# Patient Record
Sex: Female | Born: 1975 | Hispanic: No | Marital: Married | Smoking: Never smoker
Health system: Southern US, Community
[De-identification: ages and names within clinical notes are randomized; demographics above are authoritative.]

## PROBLEM LIST (undated history)

## (undated) DIAGNOSIS — Z87442 Personal history of urinary calculi: Secondary | ICD-10-CM

## (undated) DIAGNOSIS — R2231 Localized swelling, mass and lump, right upper limb: Secondary | ICD-10-CM

## (undated) DIAGNOSIS — Z98811 Dental restoration status: Secondary | ICD-10-CM

---

## 2004-07-11 DIAGNOSIS — Z87442 Personal history of urinary calculi: Secondary | ICD-10-CM

## 2004-07-11 HISTORY — DX: Personal history of urinary calculi: Z87.442

## 2012-08-24 ENCOUNTER — Other Ambulatory Visit: Payer: Self-pay | Admitting: *Deleted

## 2012-08-24 DIAGNOSIS — N838 Other noninflammatory disorders of ovary, fallopian tube and broad ligament: Secondary | ICD-10-CM

## 2012-08-26 ENCOUNTER — Emergency Department (HOSPITAL_BASED_OUTPATIENT_CLINIC_OR_DEPARTMENT_OTHER)
Admission: EM | Admit: 2012-08-26 | Discharge: 2012-08-26 | Disposition: A | Discharge status: Home / Self Care | Payer: 59 | Attending: Emergency Medicine | Admitting: Emergency Medicine

## 2012-08-26 ENCOUNTER — Emergency Department (HOSPITAL_BASED_OUTPATIENT_CLINIC_OR_DEPARTMENT_OTHER): Payer: 59

## 2012-08-26 ENCOUNTER — Encounter (HOSPITAL_BASED_OUTPATIENT_CLINIC_OR_DEPARTMENT_OTHER): Payer: Self-pay | Admitting: *Deleted

## 2012-08-26 DIAGNOSIS — IMO0002 Reserved for concepts with insufficient information to code with codable children: Secondary | ICD-10-CM | POA: Insufficient documentation

## 2012-08-26 DIAGNOSIS — X500XXA Overexertion from strenuous movement or load, initial encounter: Secondary | ICD-10-CM | POA: Insufficient documentation

## 2012-08-26 DIAGNOSIS — Z3202 Encounter for pregnancy test, result negative: Secondary | ICD-10-CM | POA: Insufficient documentation

## 2012-08-26 DIAGNOSIS — Y9239 Other specified sports and athletic area as the place of occurrence of the external cause: Secondary | ICD-10-CM | POA: Insufficient documentation

## 2012-08-26 DIAGNOSIS — S76219A Strain of adductor muscle, fascia and tendon of unspecified thigh, initial encounter: Secondary | ICD-10-CM

## 2012-08-26 DIAGNOSIS — Y9373 Activity, racquet and hand sports: Secondary | ICD-10-CM | POA: Insufficient documentation

## 2012-08-26 DIAGNOSIS — Y92838 Other recreation area as the place of occurrence of the external cause: Secondary | ICD-10-CM | POA: Insufficient documentation

## 2012-08-26 DIAGNOSIS — R296 Repeated falls: Secondary | ICD-10-CM | POA: Insufficient documentation

## 2012-08-26 MED ORDER — IBUPROFEN 800 MG PO TABS
800.0000 mg | ORAL_TABLET | Freq: Once | ORAL | Status: AC
  Administered 2012-08-26: 800 mg via ORAL
  Filled 2012-08-26: qty 1

## 2012-08-26 MED ORDER — OXYCODONE-ACETAMINOPHEN 5-325 MG PO TABS: 2.0000 | ORAL_TABLET | ORAL | Status: DC | PRN

## 2012-08-26 MED ORDER — IBUPROFEN 800 MG PO TABS: 800.0000 mg | ORAL_TABLET | Freq: Three times a day (TID) | ORAL | Status: DC

## 2012-08-26 MED ORDER — OXYCODONE-ACETAMINOPHEN 5-325 MG PO TABS
1.0000 | ORAL_TABLET | Freq: Once | ORAL | Status: AC
  Administered 2012-08-26: 1 via ORAL
  Filled 2012-08-26 (×2): qty 1

## 2012-08-26 MED ORDER — FENTANYL CITRATE 0.05 MG/ML IJ SOLN
INTRAMUSCULAR | Status: AC
  Administered 2012-08-26: 50 ug
  Filled 2012-08-26: qty 2

## 2012-08-26 NOTE — ED Provider Notes (Signed)
History     CSN: [id number]  Arrival date & time 08/26/12  1826   First MD Initiated Contact with Patient 08/26/12 2105      Chief Complaint  Patient presents with  . Groin Pain    (Consider location/radiation/quality/duration/timing/severity/associated sxs/prior treatment) Patient is a 37 y.o. female presenting with leg pain. The history is provided by the patient. No language interpreter was used.  Leg Pain Location:  Leg Time since incident:  3 hours Injury: yes   Mechanism of injury: fall   Fall:    Height of fall:  Pt twisted and fell while playing tennis   Impact surface:  Athletic surface Leg location:  R upper leg Pain details:    Quality:  Aching   Radiates to:  R leg   Severity:  Severe   Onset quality:  Sudden   Progression:  Worsening Chronicity:  New Dislocation: no     History reviewed. No pertinent past medical history.  History reviewed. No pertinent past surgical history.  History reviewed. No pertinent family history.  History  Substance Use Topics  . Smoking status: Never Smoker   . Smokeless tobacco: Not on file  . Alcohol Use: No    OB History   Grav Para Term Preterm Abortions TAB SAB Ect Mult Living                  Review of Systems  Musculoskeletal: Positive for myalgias and joint swelling.    Allergies  Review of patient's allergies indicates no known allergies.  Home Medications   Current Outpatient Rx  Name  Route  Sig  Dispense  Refill  . AMOXICILLIN PO   Oral   Take by mouth.           BP 97/57  Pulse 86  Temp(Src) 98.1 F (36.7 C) (Oral)  Resp 20  Ht 5\' 1"  (1.549 m)  Wt 106 lb (48.081 kg)  BMI 20.04 kg/m2  SpO2 96%  LMP 08/10/2012  Physical Exam  Nursing note and vitals reviewed. Constitutional: She is oriented to person, place, and time. She appears well-developed and well-nourished.  HENT:  Head: Normocephalic.  Cardiovascular: Normal rate.   Pulmonary/Chest: Effort normal.  Musculoskeletal:  She exhibits tenderness.  Tender right groin area to palpation,  Pain with range of motion    Neurological: She is alert and oriented to person, place, and time. She has normal reflexes.  Skin: Skin is warm.  Psychiatric: She has a normal mood and affect.    ED Course  Procedures (including critical care time)  Labs Reviewed  PREGNANCY, URINE   Dg Hip Complete Right  08/26/2012  *RADIOLOGY REPORT*  Clinical Data: Fall.  Right hip pain.  RIGHT HIP - COMPLETE 2+ VIEW  Comparison: None.  Findings: The bony pelvis, sacrum and sacroiliac joints are normal. The hip joints are symmetrical.  Frontal and lateral views of the proximal right femur show no evidence of trauma.  There are no soft tissue abnormalities.  IMPRESSION: No evidence of acute skeletal trauma.   Original Report Authenticated By: Sander Radon, M.D.      No diagnosis found.    MDM  Pt given percocet,   Crutches.   I advised see Dr. Pearletha Forge for recheck this week.        Lonia Skinner [place], [place] 08/26/12 2205

## 2012-08-26 NOTE — ED Notes (Signed)
Pt was playing tennis earlier today twisted her right leg in the groin area. Now c/o pain to same

## 2012-08-26 NOTE — ED Provider Notes (Signed)
Medical screening examination/treatment/procedure(s) were performed by non-physician practitioner and as supervising physician I was immediately available for consultation/collaboration.  Ethelda Chick, MD 08/26/12 2215

## 2012-08-26 NOTE — Discharge Instructions (Signed)
Muscle Strain °Muscle strain occurs when a muscle is stretched beyond its normal length. A small number of muscle fibers generally are torn. This is especially common in athletes. This happens when a sudden, violent force placed on a muscle stretches it too far. Usually, recovery from muscle strain takes 1 to 2 weeks. Complete healing will take 5 to 6 weeks.  °HOME CARE INSTRUCTIONS  °· While awake, apply ice to the sore muscle for the first 2 days after the injury. °· Put ice in a plastic bag. °· Place a towel between your skin and the bag. °· Leave the ice on for 15 to 20 minutes each hour. °· Do not use the strained muscle for several days, until you no longer have pain. °· You may wrap the injured area with an elastic bandage for comfort. Be careful not to wrap it too tightly. This may interfere with blood circulation or increase swelling. °· Only take over-the-counter or prescription medicines for pain, discomfort, or fever as directed by your caregiver. °SEEK MEDICAL CARE IF:  °You have increasing pain or swelling in the injured area. °MAKE SURE YOU:  °· Understand these instructions. °· Will watch your condition. °· Will get help right away if you are not doing well or get worse. °Document Released: 06/27/2005 Document Revised: 09/19/2011 Document Reviewed: 07/09/2011 °[employer]® Patient Information ©2013 [employer]. ° °

## 2012-08-29 ENCOUNTER — Ambulatory Visit
Admission: RE | Admit: 2012-08-29 | Discharge: 2012-08-29 | Disposition: A | Discharge status: Home / Self Care | Payer: 59 | Source: Ambulatory Visit | Attending: *Deleted | Admitting: *Deleted

## 2013-05-29 ENCOUNTER — Other Ambulatory Visit: Payer: Self-pay | Admitting: Internal Medicine

## 2013-06-13 ENCOUNTER — Other Ambulatory Visit: Payer: Self-pay | Admitting: Internal Medicine

## 2013-06-13 DIAGNOSIS — N63 Unspecified lump in unspecified breast: Secondary | ICD-10-CM

## 2013-06-21 ENCOUNTER — Other Ambulatory Visit: Payer: Self-pay | Admitting: Internal Medicine

## 2013-06-21 ENCOUNTER — Other Ambulatory Visit: Payer: Self-pay

## 2013-06-21 DIAGNOSIS — N63 Unspecified lump in unspecified breast: Secondary | ICD-10-CM

## 2013-06-27 ENCOUNTER — Other Ambulatory Visit: Payer: 59

## 2013-07-22 ENCOUNTER — Other Ambulatory Visit: Payer: Self-pay | Admitting: Internal Medicine

## 2013-07-22 DIAGNOSIS — N63 Unspecified lump in unspecified breast: Secondary | ICD-10-CM

## 2013-07-24 ENCOUNTER — Other Ambulatory Visit: Payer: Self-pay | Admitting: Internal Medicine

## 2013-07-24 DIAGNOSIS — N63 Unspecified lump in unspecified breast: Secondary | ICD-10-CM

## 2013-07-25 ENCOUNTER — Ambulatory Visit
Admission: RE | Admit: 2013-07-25 | Discharge: 2013-07-25 | Disposition: A | Discharge status: Home / Self Care | Payer: 59 | Source: Ambulatory Visit | Attending: Internal Medicine | Admitting: Internal Medicine

## 2013-07-25 ENCOUNTER — Other Ambulatory Visit: Payer: 59

## 2013-07-25 DIAGNOSIS — N63 Unspecified lump in unspecified breast: Secondary | ICD-10-CM

## 2013-07-31 ENCOUNTER — Other Ambulatory Visit: Payer: 59

## 2015-04-17 ENCOUNTER — Other Ambulatory Visit: Payer: Self-pay | Admitting: Orthopaedic Surgery

## 2015-04-17 DIAGNOSIS — R2231 Localized swelling, mass and lump, right upper limb: Secondary | ICD-10-CM

## 2015-04-20 ENCOUNTER — Ambulatory Visit
Admission: RE | Admit: 2015-04-20 | Discharge: 2015-04-20 | Disposition: A | Discharge status: Home / Self Care | Payer: 59 | Source: Ambulatory Visit | Attending: Orthopaedic Surgery | Admitting: Orthopaedic Surgery

## 2015-04-20 DIAGNOSIS — R2231 Localized swelling, mass and lump, right upper limb: Secondary | ICD-10-CM

## 2015-04-20 MED ORDER — GADOBENATE DIMEGLUMINE 529 MG/ML IV SOLN
10.0000 mL | Freq: Once | INTRAVENOUS | Status: AC | PRN
  Administered 2015-04-20: 10 mL via INTRAVENOUS

## 2015-04-29 ENCOUNTER — Other Ambulatory Visit: Payer: Self-pay | Admitting: Orthopedic Surgery

## 2015-05-01 ENCOUNTER — Other Ambulatory Visit: Payer: Self-pay

## 2015-05-12 ENCOUNTER — Encounter (HOSPITAL_BASED_OUTPATIENT_CLINIC_OR_DEPARTMENT_OTHER): Payer: Self-pay | Admitting: *Deleted

## 2015-05-12 DIAGNOSIS — R2231 Localized swelling, mass and lump, right upper limb: Secondary | ICD-10-CM

## 2015-05-12 HISTORY — DX: Localized swelling, mass and lump, right upper limb: R22.31

## 2015-05-13 NOTE — Pre-Procedure Instructions (Signed)
[employer] is to interpret for pt., per [place] in [employer]; please call [phone] if surgery time changes.

## 2015-05-14 NOTE — H&P (Signed)
Hannah ClarkYumi Wilkins is an 39 y.o. female.   CC / Reason for Visit: Right small finger mass HPI: Is patient is a 39 year old Asian female who presents for evaluation of a right small finger mass.  She reports that it has developed over the course of about 3 months.  She denies any numbness or tingling with it.  Motion is inhibited.  She underwent MRI evaluation on 04-20-15, which revealed a solid mass 6 x 6 x 9 mm on the radial aspect of the digit near the base of the middle phalanx.  The differential was broad.  Past Medical History  Diagnosis Date  . History of kidney stones 2006  . Dental crown present   . Mass of finger of right hand 05/2015    small finger    History reviewed. No pertinent past surgical history.  History reviewed. No pertinent family history. Social History:  reports that she has never smoked. She has never used smokeless tobacco. She reports that she drinks alcohol. She reports that she does not use illicit drugs.  Allergies: No Known Allergies  No prescriptions prior to admission    No results found for this or any previous visit (from the past 48 hour(s)). No results found.  Review of Systems  All other systems reviewed and are negative.   Height 5' 0.63" (1.54 m), weight 51 kg (112 lb 7 oz), last menstrual period 04/15/2015. Physical Exam  Constitutional:  WD, WN, NAD HEENT:  NCAT, EOMI Neuro/Psych:  Alert & oriented to person, place, and time; appropriate mood & affect Lymphatic: No generalized UE edema or lymphadenopathy Extremities / MSK:  Both UE are normal with respect to appearance, ranges of motion, joint stability, muscle strength/tone, sensation, & perfusion except as otherwise noted:  Right small finger with oblong mass about half millimeter by 1 mm in length on the radial aspect of the level of the PIP joint.  There is no Tinel sign with percussion of the mass.  Intact normal light touch sensibility on the radial ulnar aspects of the digital  tip  Labs / X-rays: No radiographic studies obtained today.  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  We will send the tissue to pathology for tissue specific pathological diagnosis.  I discussed with her the possibility of injury to the digital nerve or vessel that could result in compromise blood flow to the digit or pain or altered sensibility.  In addition she could develop stiffness postoperatively in the digit.  The details of the operative procedure were discussed with the patient.  Questions were invited and answered.  In addition to the goal of the procedure, the risks of the procedure to include but not limited to bleeding; infection; damage to the nerves or blood vessels that could result in bleeding, numbness, weakness, chronic pain, and the need for additional procedures; stiffness; the need for revision surgery; and anesthetic risks, were reviewed.  No specific outcome was guaranteed or implied.  Informed consent was obtained.  She was sent to meet with Rosanne SackKasey to schedule surgery.  Onix Jumper A. 05/14/2015, 11:30 AM

## 2015-05-15 NOTE — Pre-Procedure Instructions (Signed)
Just received email from [place] in [employer] that [employer] will not be able to send a [place] interpreter; will need to use [employer] at [phone];

## 2015-05-18 ENCOUNTER — Ambulatory Visit (HOSPITAL_BASED_OUTPATIENT_CLINIC_OR_DEPARTMENT_OTHER)
Admission: RE | Admit: 2015-05-18 | Discharge: 2015-05-18 | Disposition: A | Discharge status: Home / Self Care | Payer: 59 | Source: Ambulatory Visit | Attending: Orthopedic Surgery | Admitting: Orthopedic Surgery

## 2015-05-18 ENCOUNTER — Encounter (HOSPITAL_BASED_OUTPATIENT_CLINIC_OR_DEPARTMENT_OTHER)
Admission: RE | Disposition: A | Discharge status: Home / Self Care | Payer: Self-pay | Source: Ambulatory Visit | Attending: Orthopedic Surgery

## 2015-05-18 ENCOUNTER — Encounter (HOSPITAL_BASED_OUTPATIENT_CLINIC_OR_DEPARTMENT_OTHER): Payer: Self-pay | Admitting: Anesthesiology

## 2015-05-18 ENCOUNTER — Ambulatory Visit (HOSPITAL_BASED_OUTPATIENT_CLINIC_OR_DEPARTMENT_OTHER): Payer: 59 | Admitting: Anesthesiology

## 2015-05-18 DIAGNOSIS — R2231 Localized swelling, mass and lump, right upper limb: Secondary | ICD-10-CM | POA: Diagnosis present

## 2015-05-18 DIAGNOSIS — E65 Localized adiposity: Secondary | ICD-10-CM | POA: Insufficient documentation

## 2015-05-18 HISTORY — PX: EXCISION METACARPAL MASS: SHX6372

## 2015-05-18 HISTORY — DX: Personal history of urinary calculi: Z87.442

## 2015-05-18 HISTORY — DX: Localized swelling, mass and lump, right upper limb: R22.31

## 2015-05-18 HISTORY — DX: Dental restoration status: Z98.811

## 2015-05-18 SURGERY — EXCISION METACARPAL MASS
Anesthesia: Monitor Anesthesia Care | Site: Finger | Laterality: Right

## 2015-05-18 MED ORDER — MIDAZOLAM HCL 5 MG/5ML IJ SOLN
INTRAMUSCULAR | Status: DC | PRN
  Administered 2015-05-18: 2 mg via INTRAVENOUS

## 2015-05-18 MED ORDER — LACTATED RINGERS IV SOLN
INTRAVENOUS | Status: DC
  Administered 2015-05-18: 10 mL/h via INTRAVENOUS

## 2015-05-18 MED ORDER — PROPOFOL 10 MG/ML IV BOLUS
INTRAVENOUS | Status: AC
  Filled 2015-05-18: qty 20

## 2015-05-18 MED ORDER — BUPIVACAINE-EPINEPHRINE 0.5% -1:200000 IJ SOLN
INTRAMUSCULAR | Status: DC | PRN
  Administered 2015-05-18: 3.5 mL

## 2015-05-18 MED ORDER — DEXAMETHASONE SODIUM PHOSPHATE 10 MG/ML IJ SOLN
INTRAMUSCULAR | Status: AC
  Filled 2015-05-18: qty 1

## 2015-05-18 MED ORDER — ONDANSETRON HCL 4 MG/2ML IJ SOLN: 4.0000 mg | Freq: Once | INTRAMUSCULAR | Status: DC | PRN

## 2015-05-18 MED ORDER — PROPOFOL 10 MG/ML IV BOLUS
INTRAVENOUS | Status: DC | PRN
  Administered 2015-05-18 (×2): 20 mg via INTRAVENOUS

## 2015-05-18 MED ORDER — ONDANSETRON HCL 4 MG/2ML IJ SOLN
INTRAMUSCULAR | Status: DC | PRN
  Administered 2015-05-18: 4 mg via INTRAVENOUS

## 2015-05-18 MED ORDER — BUPIVACAINE-EPINEPHRINE (PF) 0.5% -1:200000 IJ SOLN
INTRAMUSCULAR | Status: AC
  Filled 2015-05-18: qty 30

## 2015-05-18 MED ORDER — FENTANYL CITRATE (PF) 100 MCG/2ML IJ SOLN
INTRAMUSCULAR | Status: DC | PRN
  Administered 2015-05-18 (×2): 50 ug via INTRAVENOUS

## 2015-05-18 MED ORDER — LIDOCAINE HCL (PF) 1 % IJ SOLN
INTRAMUSCULAR | Status: DC | PRN
  Administered 2015-05-18: 3.5 mL

## 2015-05-18 MED ORDER — MIDAZOLAM HCL 2 MG/2ML IJ SOLN: 1.0000 mg | INTRAMUSCULAR | Status: DC | PRN

## 2015-05-18 MED ORDER — HYDROCODONE-ACETAMINOPHEN 5-325 MG PO TABS: 1.0000 | ORAL_TABLET | Freq: Four times a day (QID) | ORAL | Status: AC | PRN

## 2015-05-18 MED ORDER — LIDOCAINE HCL (PF) 1 % IJ SOLN
INTRAMUSCULAR | Status: AC
  Filled 2015-05-18: qty 30

## 2015-05-18 MED ORDER — FENTANYL CITRATE (PF) 100 MCG/2ML IJ SOLN: 50.0000 ug | INTRAMUSCULAR | Status: DC | PRN

## 2015-05-18 MED ORDER — SCOPOLAMINE 1 MG/3DAYS TD PT72: 1.0000 | MEDICATED_PATCH | Freq: Once | TRANSDERMAL | Status: DC | PRN

## 2015-05-18 MED ORDER — LIDOCAINE HCL (CARDIAC) 20 MG/ML IV SOLN
INTRAVENOUS | Status: AC
  Filled 2015-05-18: qty 5

## 2015-05-18 MED ORDER — CEFAZOLIN SODIUM-DEXTROSE 2-3 GM-% IV SOLR
INTRAVENOUS | Status: DC | PRN
  Administered 2015-05-18: 2 g via INTRAVENOUS

## 2015-05-18 MED ORDER — GLYCOPYRROLATE 0.2 MG/ML IJ SOLN: 0.2000 mg | Freq: Once | INTRAMUSCULAR | Status: DC | PRN

## 2015-05-18 MED ORDER — FENTANYL CITRATE (PF) 100 MCG/2ML IJ SOLN: 25.0000 ug | INTRAMUSCULAR | Status: DC | PRN

## 2015-05-18 MED ORDER — LACTATED RINGERS IV SOLN
INTRAVENOUS | Status: DC
  Administered 2015-05-18 (×2): via INTRAVENOUS

## 2015-05-18 MED ORDER — MIDAZOLAM HCL 2 MG/2ML IJ SOLN
INTRAMUSCULAR | Status: AC
  Filled 2015-05-18: qty 4

## 2015-05-18 MED ORDER — LIDOCAINE HCL (CARDIAC) 20 MG/ML IV SOLN
INTRAVENOUS | Status: DC | PRN
  Administered 2015-05-18: 50 mg via INTRAVENOUS

## 2015-05-18 MED ORDER — PROPOFOL 500 MG/50ML IV EMUL: INTRAVENOUS | Status: DC | PRN

## 2015-05-18 MED ORDER — ONDANSETRON HCL 4 MG/2ML IJ SOLN
INTRAMUSCULAR | Status: AC
  Filled 2015-05-18: qty 2

## 2015-05-18 MED ORDER — FENTANYL CITRATE (PF) 100 MCG/2ML IJ SOLN
INTRAMUSCULAR | Status: AC
  Filled 2015-05-18: qty 4

## 2015-05-18 MED ORDER — CEFAZOLIN SODIUM-DEXTROSE 2-3 GM-% IV SOLR: 2.0000 g | INTRAVENOUS | Status: DC

## 2015-05-18 SURGICAL SUPPLY — 35 items
BLADE SURG 15 STRL LF DISP TIS (BLADE) ×1 IMPLANT
BLADE SURG 15 STRL SS (BLADE) ×2
BNDG COHESIVE 1X5 TAN STRL LF (GAUZE/BANDAGES/DRESSINGS) ×3 IMPLANT
BNDG COHESIVE 4X5 TAN STRL (GAUZE/BANDAGES/DRESSINGS) ×3 IMPLANT
BNDG CONFORM 2 STRL LF (GAUZE/BANDAGES/DRESSINGS) ×3 IMPLANT
BNDG ESMARK 4X9 LF (GAUZE/BANDAGES/DRESSINGS) ×3 IMPLANT
CHLORAPREP W/TINT 26ML (MISCELLANEOUS) ×3 IMPLANT
COVER BACK TABLE 60X90IN (DRAPES) ×3 IMPLANT
COVER MAYO STAND STRL (DRAPES) ×3 IMPLANT
CUFF TOURNIQUET SINGLE 18IN (TOURNIQUET CUFF) ×3 IMPLANT
DRAPE EXTREMITY T 121X128X90 (DRAPE) ×3 IMPLANT
DRAPE SURG 17X23 STRL (DRAPES) ×3 IMPLANT
DRSG EMULSION OIL 3X3 NADH (GAUZE/BANDAGES/DRESSINGS) ×3 IMPLANT
GLOVE BIO SURGEON STRL SZ7.5 (GLOVE) ×3 IMPLANT
GLOVE BIOGEL PI IND STRL 7.0 (GLOVE) ×2 IMPLANT
GLOVE BIOGEL PI IND STRL 8 (GLOVE) ×1 IMPLANT
GLOVE BIOGEL PI INDICATOR 7.0 (GLOVE) ×4
GLOVE BIOGEL PI INDICATOR 8 (GLOVE) ×2
GLOVE ECLIPSE 6.5 STRL STRAW (GLOVE) ×6 IMPLANT
GOWN STRL REUS W/ TWL LRG LVL3 (GOWN DISPOSABLE) ×2 IMPLANT
GOWN STRL REUS W/TWL LRG LVL3 (GOWN DISPOSABLE) ×4
GOWN STRL REUS W/TWL XL LVL3 (GOWN DISPOSABLE) ×3 IMPLANT
NEEDLE HYPO 25X1 1.5 SAFETY (NEEDLE) ×3 IMPLANT
NS IRRIG 1000ML POUR BTL (IV SOLUTION) ×3 IMPLANT
PACK BASIN DAY SURGERY FS (CUSTOM PROCEDURE TRAY) ×3 IMPLANT
PADDING CAST ABS 4INX4YD NS (CAST SUPPLIES) ×2
PADDING CAST ABS COTTON 4X4 ST (CAST SUPPLIES) ×1 IMPLANT
SPONGE GAUZE 4X4 12PLY STER LF (GAUZE/BANDAGES/DRESSINGS) ×3 IMPLANT
STOCKINETTE 6  STRL (DRAPES) ×2
STOCKINETTE 6 STRL (DRAPES) ×1 IMPLANT
SUT VICRYL RAPIDE 4-0 (SUTURE) ×3 IMPLANT
SYR BULB 3OZ (MISCELLANEOUS) ×3 IMPLANT
SYRINGE 10CC LL (SYRINGE) ×3 IMPLANT
TOWEL OR 17X24 6PK STRL BLUE (TOWEL DISPOSABLE) ×3 IMPLANT
UNDERPAD 30X30 (UNDERPADS AND DIAPERS) ×3 IMPLANT

## 2015-05-18 NOTE — Transfer of Care (Signed)
Immediate Anesthesia Transfer of Care Note  Patient: Hannah ClarkYumi Wilkins  Procedure(s) Performed: Procedure(s): RIGHT SMALL FINGER MASS EXCISION (Right)  Patient Location: PACU  Anesthesia Type:MAC  Level of Consciousness: awake and patient cooperative  Airway & Oxygen Therapy: Patient Spontanous Breathing and Patient connected to face mask oxygen  Post-op Assessment: Report given to RN and Post -op Vital signs reviewed and stable  Post vital signs: Reviewed and stable  Last Vitals:  Filed Vitals:   05/18/15 1012  BP: 100/75  Pulse: 59  Temp: 36.7 C  Resp: 18    Complications: No apparent anesthesia complications

## 2015-05-18 NOTE — Anesthesia Preprocedure Evaluation (Signed)
Anesthesia Evaluation  Patient identified by MRN, date of birth, ID band Patient awake    Reviewed: Allergy & Precautions, NPO status , Patient's Chart, lab work & pertinent test results  History of Anesthesia Complications Negative for: history of anesthetic complications  Airway Mallampati: II  TM Distance: >3 FB Neck ROM: Full    Dental no notable dental hx. (+) Dental Advisory Given   Pulmonary neg pulmonary ROS,    Pulmonary exam normal breath sounds clear to auscultation       Cardiovascular negative cardio ROS Normal cardiovascular exam Rhythm:Regular Rate:Normal     Neuro/Psych negative neurological ROS  negative psych ROS   GI/Hepatic negative GI ROS, Neg liver ROS,   Endo/Other  negative endocrine ROS  Renal/GU negative Renal ROS  negative genitourinary   Musculoskeletal negative musculoskeletal ROS (+)   Abdominal   Peds negative pediatric ROS (+)  Hematology negative hematology ROS (+)   Anesthesia Other Findings   Reproductive/Obstetrics negative OB ROS                             Anesthesia Physical Anesthesia Plan  ASA: II  Anesthesia Plan: MAC   Post-op Pain Management:    Induction: Intravenous  Airway Management Planned:   Additional Equipment:   Intra-op Plan:   Post-operative Plan:   Informed Consent: I have reviewed the patients History and Physical, chart, labs and discussed the procedure including the risks, benefits and alternatives for the proposed anesthesia with the patient or authorized representative who has indicated his/her understanding and acceptance.   Dental advisory given  Plan Discussed with: CRNA  Anesthesia Plan Comments: (Case posted for MAC. Discussed possible need for conversion to general with LMA if more stimulating. Husband and wife expressed understanding and agreeable to plan.)        Anesthesia Quick Evaluation

## 2015-05-18 NOTE — Discharge Instructions (Signed)
Discharge Instructions ° ° °You have a light dressing on your hand.  °You may begin gentle motion of your fingers and hand immediately, but you should not do any heavy lifting or gripping.  Elevate your hand to reduce pain & swelling of the digits.  Ice over the operative site may be helpful to reduce pain & swelling.  DO NOT USE HEAT. °Pain medicine has been prescribed for you.  °Use your medicine as needed over the first 48 hours, and then you can begin to taper your use. You may use Tylenol in place of your prescribed pain medication, but not IN ADDITION to it. °Leave the dressing in place until the third day after your surgery and then remove it, leaving it open to air.  °After the bandage has been removed you may shower, regularly washing the incision and letting the water run over it, but not submerging it (no swimming, soaking it in dishwater, etc.) °You may drive a car when you are off of prescription pain medications and can safely control your vehicle with both hands. °We will address whether therapy will be required or not when you return to the office. °You may have already made your follow-up appointment when we completed your preop visit.  If not, please call our office today or the next business day to make your return appointment for 10-15 days after surgery. ° ° °Please call [phone] during normal business hours or [phone] after hours for any problems. Including the following: ° °- excessive redness of the incisions °- drainage for more than 4 days °- fever of more than 101.5 F ° °*Please note that pain medications will not be refilled after hours or on weekends. ° ° ° °Post Anesthesia Home Care Instructions ° °Activity: °Get plenty of rest for the remainder of the day. A responsible adult should stay with you for 24 hours following the procedure.  °For the next 24 hours, DO NOT: °-Drive a car °-Operate machinery °-Drink alcoholic beverages °-Take any medication unless instructed by your  physician °-Make any legal decisions or sign important papers. ° °Meals: °Start with liquid foods such as gelatin or soup. Progress to regular foods as tolerated. Avoid greasy, spicy, heavy foods. If nausea and/or vomiting occur, drink only clear liquids until the nausea and/or vomiting subsides. Call your physician if vomiting continues. ° °Special Instructions/Symptoms: °Your throat may feel dry or sore from the anesthesia or the breathing tube placed in your throat during surgery. If this causes discomfort, gargle with warm salt water. The discomfort should disappear within 24 hours. ° °If you had a scopolamine patch placed behind your ear for the management of post- operative nausea and/or vomiting: ° °1. The medication in the patch is effective for 72 hours, after which it should be removed.  Wrap patch in a tissue and discard in the trash. Wash hands thoroughly with soap and water. °2. You may remove the patch earlier than 72 hours if you experience unpleasant side effects which may include dry mouth, dizziness or visual disturbances. °3. Avoid touching the patch. Wash your hands with soap and water after contact with the patch. °  ° ° °Call your surgeon if you experience:  ° °1.  Fever over 101.0. °2.  Inability to urinate. °3.  Nausea and/or vomiting. °4.  Extreme swelling or bruising at the surgical site. °5.  Continued bleeding from the incision. °6.  Increased pain, redness or drainage from the incision. °7.  Problems related to your pain medication. °  8. Any change in color, movement and/or sensation °9. Any problems and/or concerns ° °

## 2015-05-18 NOTE — Anesthesia Procedure Notes (Signed)
Procedure Name: MAC Date/Time: 05/18/2015 11:01 AM Performed by: Elkton DesanctisLINKA, Rodrigus Kilker L Pre-anesthesia Checklist: Patient identified, Timeout performed, Emergency Drugs available, Suction available and Patient being monitored Patient Re-evaluated:Patient Re-evaluated prior to inductionOxygen Delivery Method: Simple face mask

## 2015-05-18 NOTE — Anesthesia Postprocedure Evaluation (Signed)
  Anesthesia Post-op Note  Patient: Hannah ClarkYumi Wilkins  Procedure(s) Performed: Procedure(s) (LRB): RIGHT SMALL FINGER MASS EXCISION (Right)  Patient Location: PACU  Anesthesia Type: MAC  Level of Consciousness: awake and alert   Airway and Oxygen Therapy: Patient Spontanous Breathing  Post-op Pain: mild  Post-op Assessment: Post-op Vital signs reviewed, Patient's Cardiovascular Status Stable, Respiratory Function Stable, Patent Airway and No signs of Nausea or vomiting  Last Vitals:  Filed Vitals:   05/18/15 1145  BP: 96/65  Pulse: 47  Temp:   Resp: 13    Post-op Vital Signs: stable   Complications: No apparent anesthesia complications

## 2015-05-18 NOTE — Op Note (Addendum)
05/18/2015  10:55 AM  PATIENT:  Hannah Wilkins  39 y.o. female  PRE-OPERATIVE DIAGNOSIS:  R SF soft tissue mass  POST-OPERATIVE DIAGNOSIS:  Same  PROCEDURE:  Excision of R SF soft tissue mass  SURGEON: Cliffton Astersavid A. Janee Mornhompson, MD  PHYSICIAN ASSISTANT: Danielle RankinKirsten Schrader, OPA-C  ANESTHESIA:  local and MAC  SPECIMENS:  Mass to pathology  DRAINS:   None  EBL:  less than 50 mL  PREOPERATIVE INDICATIONS:  Hannah Wilkins is a  39 y.o. female with an enlarging R SF soft-tissue mass  The risks benefits and alternatives were discussed with the patient preoperatively including but not limited to the risks of infection, bleeding, nerve injury, cardiopulmonary complications, the need for revision surgery, among others, and the patient verbalized understanding and consented to proceed.  OPERATIVE IMPLANTS: none  OPERATIVE PROCEDURE:  After receiving prophylactic antibiotics, the patient was escorted to the operative theatre and placed in a supine position.  A surgical "time-out" was performed during which the planned procedure, proposed operative site, and the correct patient identity were compared to the operative consent and agreement confirmed by the circulating nurse according to current facility policy.  A digital block with a mixture of lidocaine and Marcaine bearing epinephrine was instilled by me. Following application of a tourniquet to the operative extremity, the exposed skin was prepped with Chloraprep and draped in the usual sterile fashion.  The limb was exsanguinated with an Esmarch bandage and the tourniquet inflated to approximately 100mmHg higher than systolic BP.  A mid axial incision was made sharply over the prominence of the mass on the radial side of the right small finger.  The skin was incised and subcutaneous tissues were dissected with blunt and spreading dissection. The mass was encountered. It was whitish, somewhat granular, not well encapsulated, soft, and oblong. It was  marginally excised, albeit not extremely well encapsulated. It was somewhat even friable.  Tourniquet was released, some additional hemostasis obtained with direct pressure and the wound was irrigated before being closed with 4-0 Vicryl Rapide interrupted sutures. A digital dressing was applied. She was taken to the recovery room stable condition.  DISPOSITION: The mass will be sent to pathology. She will return 10-15 days for reevaluation and review of pathology report.

## 2015-05-18 NOTE — Interval H&P Note (Signed)
History and Physical Interval Note:  05/18/2015 10:55 AM  Hannah ClarkYumi Fusaro  has presented today for surgery, with the diagnosis of RIGHT SMALL FINGER MASS  The various methods of treatment have been discussed with the patient and family. After consideration of risks, benefits and other options for treatment, the patient has consented to  Procedure(s): RIGHT SMALL FINGER MASS EXCISION (Right) as a surgical intervention .  The patient's history has been reviewed, patient examined, no change in status, stable for surgery.  I have reviewed the patient's chart and labs.  Questions were answered to the patient's satisfaction.     [name]

## 2015-05-19 ENCOUNTER — Encounter (HOSPITAL_BASED_OUTPATIENT_CLINIC_OR_DEPARTMENT_OTHER): Payer: Self-pay | Admitting: Orthopedic Surgery

## 2016-02-09 IMAGING — MR MR [PERSON_NAME]*[PERSON_NAME]* WO/W CM
7 of 10 series · 21 of 40 positions shown · IV contrast (10ml multihance)
Comparison: None.

CLINICAL DATA: Mass on the lateral aspect of the pinky.

EXAM:
MRI OF THE RIGHT HAND WITHOUT AND WITH CONTRAST
TECHNIQUE: Multiplanar, multisequence MR imaging was performed both before and
after administration of intravenous contrast.
CONTRAST:  10mL MULTIHANCE GADOBENATE DIMEGLUMINE 529 MG/ML IV SOLN

[Series 3: T1 · coronal · 3.0mm · 0.68mm/px · 3 of 12 slices shown (1 of 2)]
[im 1/12]
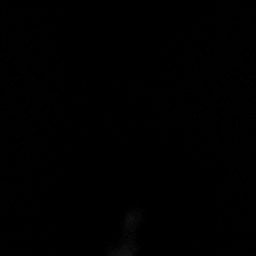
[im 6/12]
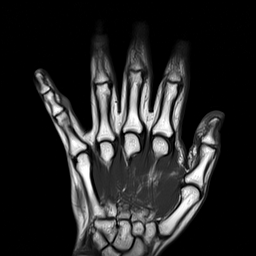
[im 12/12]
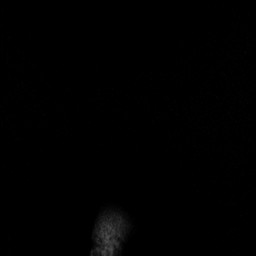

[Series 4: T2 fat-sat · coronal · 3.0mm · 0.33mm/px · 2 of 12 slices shown (1 of 4)]
[im 1/12]
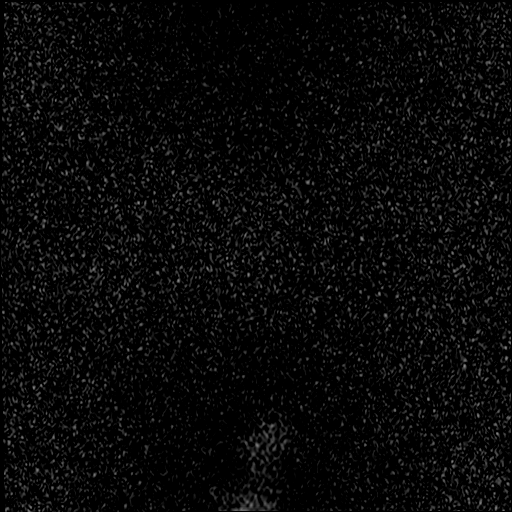
[im 12/12]
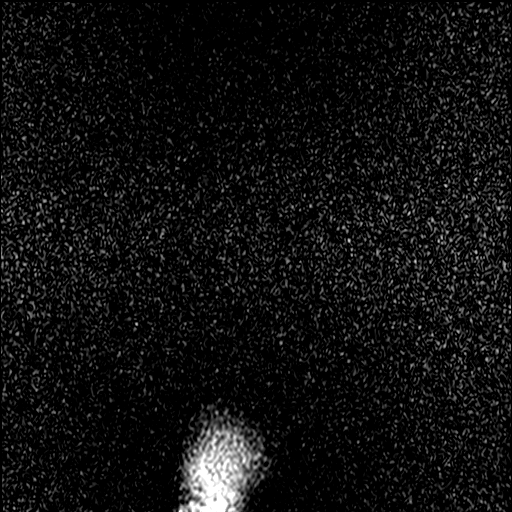

[Series 5: PD fat-sat · coronal · 3.0mm · 0.33mm/px · 2 of 12 slices shown]
[im 1/12]
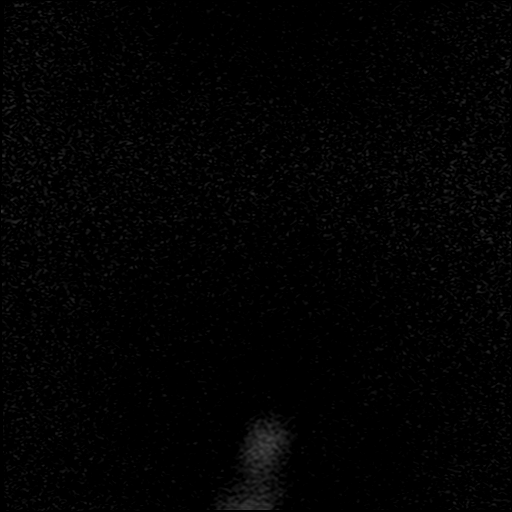
[im 12/12]
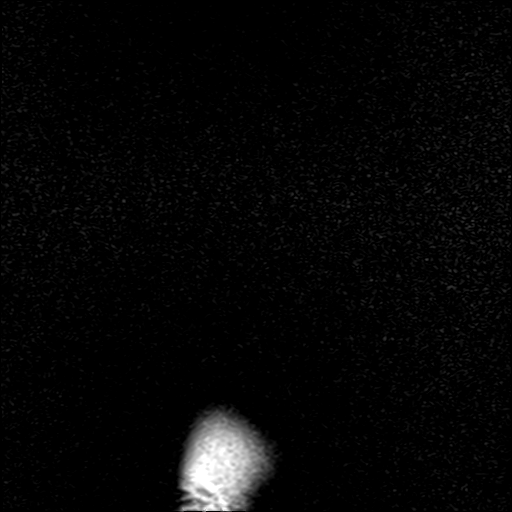

[Series 6: T1 · axial · 4.5mm · 0.29mm/px · 1 of 29 slices shown (2 of 2)]
[im 1/29]
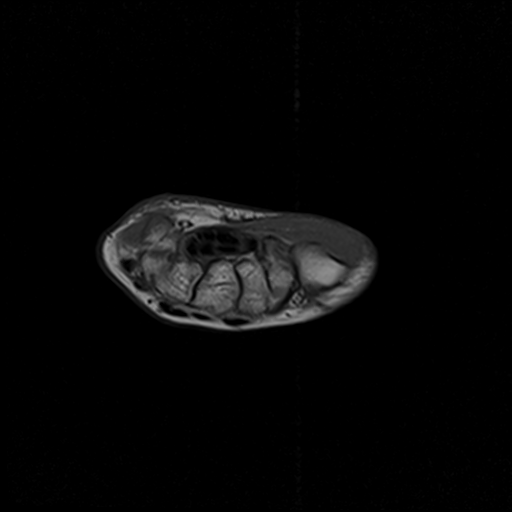

[Series 8: T2 fat-sat · axial · 4.5mm · 0.29mm/px · z∈[-74,+77]mm · 6 of 29 slices shown (2 of 4)]
[im 1/29]
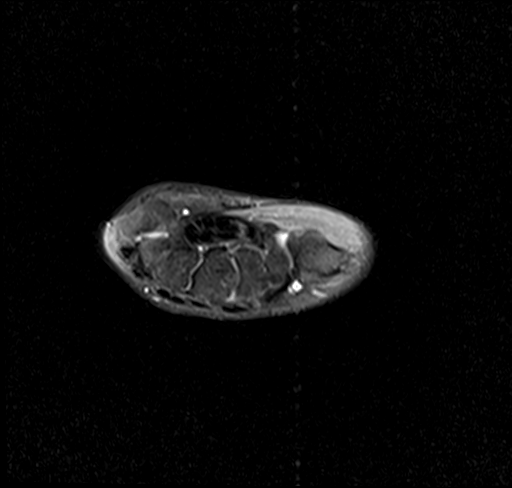
[im 6/29]
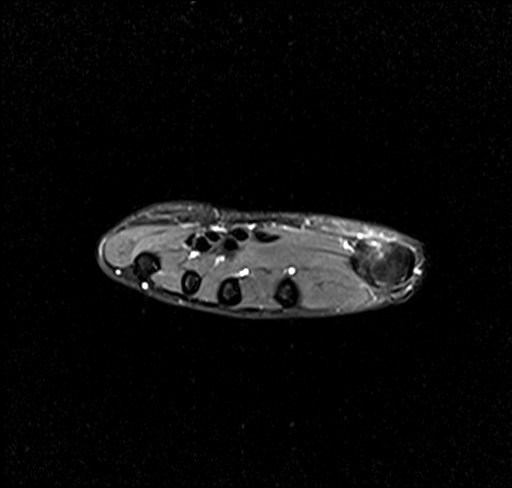
[im 12/29]
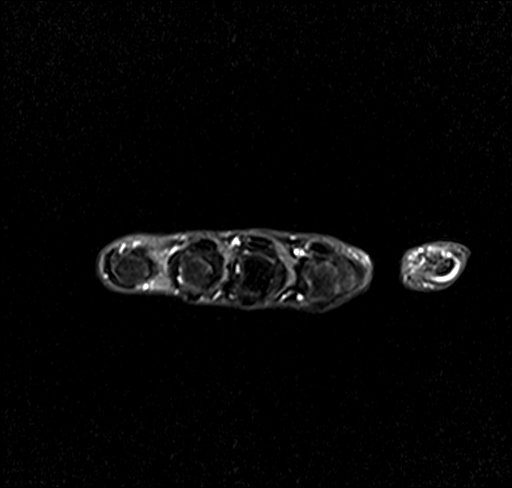
[im 17/29]
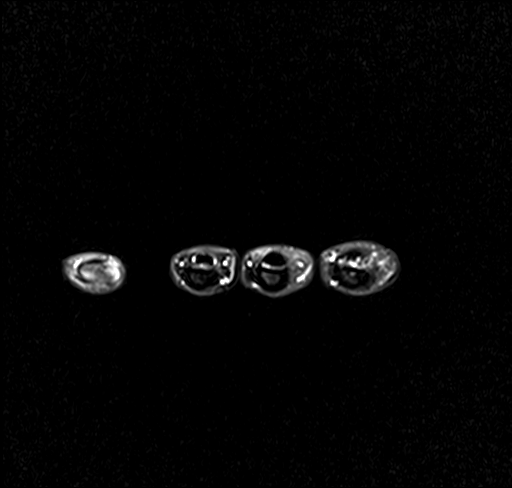
[im 23/29]
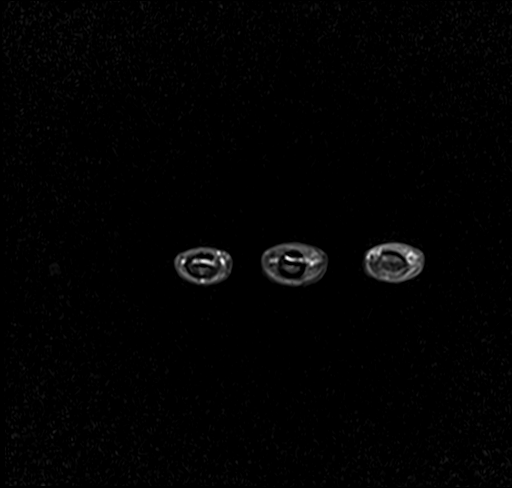
[im 29/29]
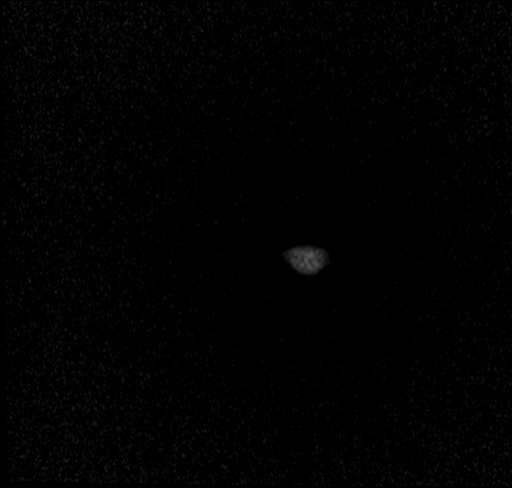

[Series 9: T2 fat-sat · sagittal · 4.0mm · 0.33mm/px · 6 of 29 slices shown (3 of 4)]
[im 1/29]
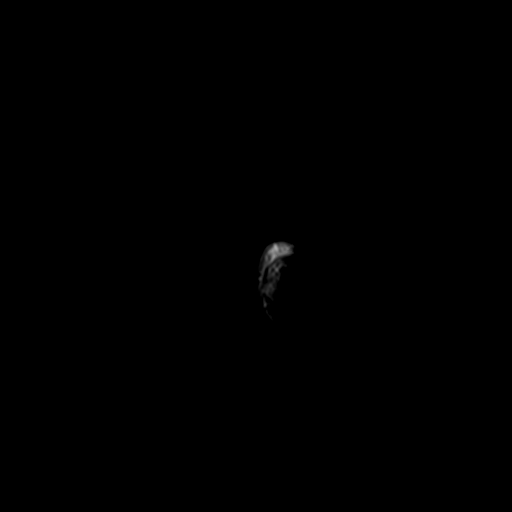
[im 6/29]
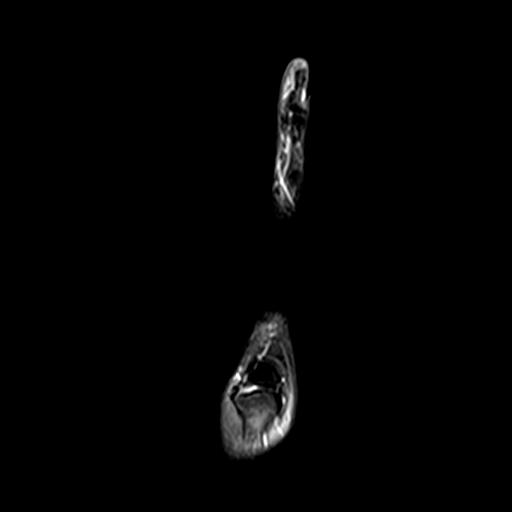
[im 12/29]
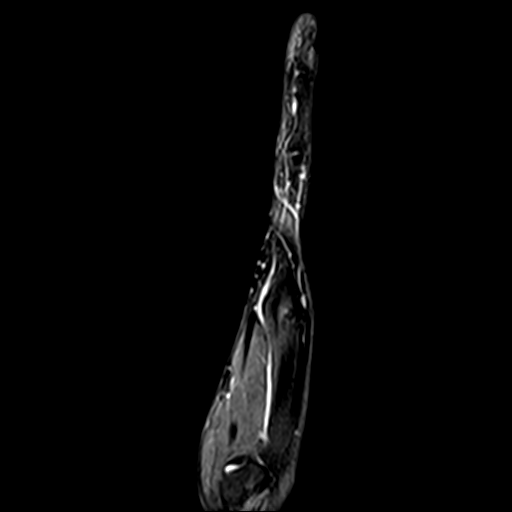
[im 17/29]
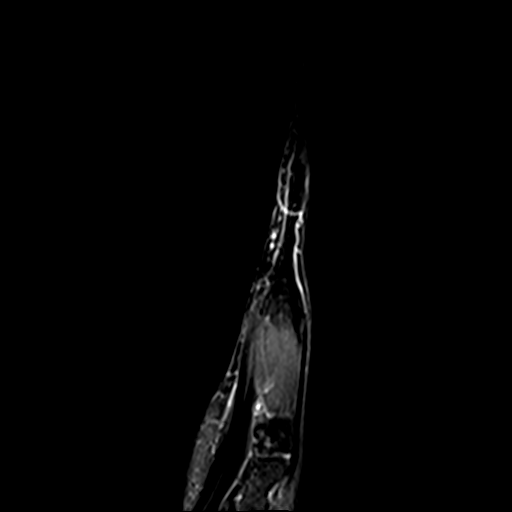
[im 23/29]
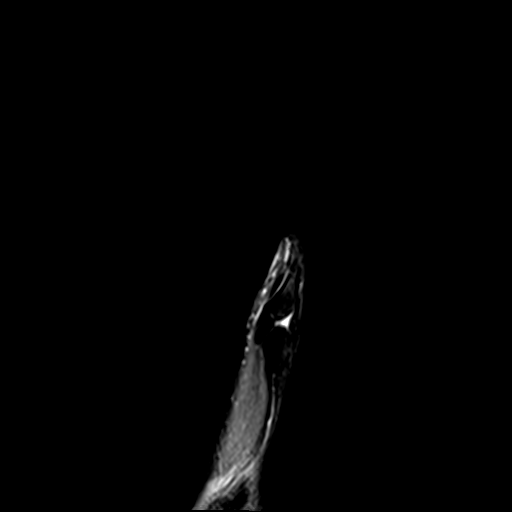
[im 29/29]
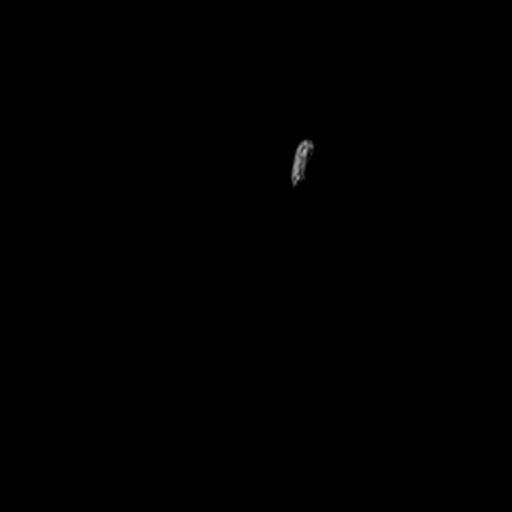

[Series 10: T2 fat-sat · sagittal · 3.0mm · 0.27mm/px · 1 of 7 slices shown (4 of 4)]
[im 1/7]
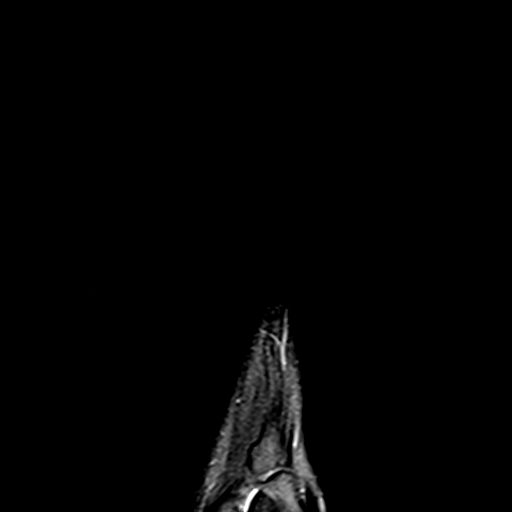

[21 of 40 positions shown; findings below may reference images not displayed]

FINDINGS: No marrow signal abnormality. No fracture or dislocation. No
periosteal reaction or bone destruction. No joint effusion. Normal
alignment. No erosive changes.

Extensor and flexor compartment tendons are intact. There is an
elliptical-shaped 5.6 x 5.8 x 9.1 cm T1 intermediate signal, T2
hyperintense and homogeneously enhancing mass along the radial
aspect of the fifth phalanx of the level of the base of the middle
phalanx. The distal aspect of the mass mass abuts the radial most
aspect of the flexor digitorum tendon.

No other soft tissue mass or hematoma.  No fluid collection.
IMPRESSION: 1. There is an elliptical-shaped 5.6 x 5.8 x 9.1 cm enhancing mass
along the radial aspect of the fifth phalanx of the level of the
base of the middle phalanx. Differential diagnosis includes
peripheral nerve sheath tumor, myxoid neurofibroma versus giant cell
tumor of the tendon sheath. Other etiologies which are considered
less likely includes vascular malformation or synovial cell sarcoma.
